# Patient Record
Sex: Male | Born: 2000 | Race: Black or African American | Hispanic: No | Marital: Single | Smoking: Never smoker
Health system: Southern US, Community
[De-identification: ages and names within clinical notes are randomized; demographics above are authoritative.]

---
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
## 2024-01-18 ENCOUNTER — Encounter (HOSPITAL_COMMUNITY): Payer: Self-pay

## 2024-01-18 ENCOUNTER — Ambulatory Visit (HOSPITAL_COMMUNITY)
Admission: EM | Admit: 2024-01-18 | Discharge: 2024-01-18 | Disposition: A | Discharge status: Home / Self Care | Payer: Self-pay | Attending: Emergency Medicine | Admitting: Emergency Medicine

## 2024-01-18 ENCOUNTER — Encounter (HOSPITAL_COMMUNITY): Payer: Self-pay | Admitting: *Deleted

## 2024-01-18 ENCOUNTER — Emergency Department (HOSPITAL_COMMUNITY)
Admission: EM | Admit: 2024-01-18 | Discharge: 2024-01-18 | Discharge status: Against Medical Advice | Payer: Self-pay | Attending: Emergency Medicine | Admitting: Emergency Medicine

## 2024-01-18 ENCOUNTER — Other Ambulatory Visit: Payer: Self-pay

## 2024-01-18 DIAGNOSIS — J392 Other diseases of pharynx: Secondary | ICD-10-CM

## 2024-01-18 DIAGNOSIS — R682 Dry mouth, unspecified: Secondary | ICD-10-CM | POA: Insufficient documentation

## 2024-01-18 DIAGNOSIS — Z5321 Procedure and treatment not carried out due to patient leaving prior to being seen by health care provider: Secondary | ICD-10-CM | POA: Insufficient documentation

## 2024-01-18 LAB — POCT RAPID STREP A (OFFICE): Rapid Strep A Screen: NEGATIVE

## 2024-01-18 NOTE — ED Triage Notes (Signed)
 Patient c/o spitting up mucous for a while now. Patient states he has dry mouth. Patient is a current every day vape smoker.

## 2024-01-18 NOTE — ED Provider Notes (Signed)
 [facility]    CSN: [id number] Arrival date & time: 01/18/24  1654      History   Chief Complaint Chief Complaint  Patient presents with   Dry mouth    HPI Leotha Voeltz is a 23 y.o. male.  3 months of dry mouth and throat Reports vapes every day and the dryness happens after. No throat pain. No fever or cough  Reports moving to [place] in 1 week  History reviewed. No pertinent past medical history.  There are no active problems to display for this patient.   History reviewed. No pertinent surgical history.     Home Medications    Prior to Admission medications   Not on File    Family History History reviewed. No pertinent family history.  Social History Social History   Tobacco Use   Smoking status: Never  Vaping Use   Vaping status: Every Day  Substance Use Topics   Alcohol use: Yes   Drug use: Never     Allergies   Patient has no known allergies.   Review of Systems Review of Systems Per HPI  Physical Exam Triage Vital Signs ED Triage Vitals [01/18/24 1826]  Encounter Vitals Group     BP (!) 144/78     Systolic BP Percentile      Diastolic BP Percentile      Pulse Rate 73     Resp 18     Temp 98 F (36.7 C)     Temp Source Oral     SpO2 95 %     Weight      Height      Head Circumference      Peak Flow      Pain Score      Pain Loc      Pain Education      Exclude from Growth Chart    No data found.  Updated Vital Signs BP (!) 144/78 (BP Location: Left Arm)   Pulse 73   Temp 98 F (36.7 C) (Oral)   Resp 18   SpO2 95%   Visual Acuity Right Eye Distance:   Left Eye Distance:   Bilateral Distance:    Right Eye Near:   Left Eye Near:    Bilateral Near:     Physical Exam Vitals and nursing note reviewed.  Constitutional:      Appearance: He is not ill-appearing.  HENT:     Right Ear: Tympanic membrane and ear canal normal.     Left Ear: Tympanic membrane and ear canal normal.     Nose: No congestion  or rhinorrhea.     Mouth/Throat:     Mouth: Mucous membranes are moist.     Pharynx: Oropharynx is clear. No oropharyngeal exudate or posterior oropharyngeal erythema.  Eyes:     Conjunctiva/sclera: Conjunctivae normal.  Cardiovascular:     Rate and Rhythm: Normal rate and regular rhythm.     Pulses: Normal pulses.     Heart sounds: Normal heart sounds.  Pulmonary:     Effort: Pulmonary effort is normal. No respiratory distress.     Breath sounds: Normal breath sounds. No wheezing.  Musculoskeletal:     Cervical back: Normal range of motion.  Lymphadenopathy:     Cervical: No cervical adenopathy.  Skin:    General: Skin is warm and dry.  Neurological:     Mental Status: He is alert and oriented to person, place, and time.     UC Treatments /  Results  Labs (all labs ordered are listed, but only abnormal results are displayed) Labs Reviewed  POCT RAPID STREP A (OFFICE)    EKG  Radiology No results found.  Procedures Procedures (including critical care time)  Medications Ordered in UC Medications - No data to display  Initial Impression / Assessment and Plan / UC Course  I have reviewed the triage vital signs and the nursing notes.  Pertinent labs & imaging results that were available during my care of the patient were reviewed by me and considered in my medical decision making (see chart for details).  A strep test was performed by triage CMA before provider notified. Test was not indicated and not appropriate; please disregard  3+ months of dry throat occurs after vaping No abnormality on exam Eating and drinking normally Advised stop vaping When moves out of state advised establish with PCP for follow up  Final Clinical Impressions(s) / UC Diagnoses   Final diagnoses:  Dry throat     Discharge Instructions      I recommend to stop vaping as this is likely making your throat dry.  When you move to [facility], please establish with a primary care as soon  as able     ED Prescriptions   None    PDMP not reviewed this encounter.   Brandii Lakey, Ray Church 01/18/24 1096

## 2024-01-18 NOTE — ED Triage Notes (Signed)
 The pt is c/o a dry throat for 3-4 months and he has been spitting s lot for a year  no distress

## 2024-01-18 NOTE — Discharge Instructions (Addendum)
 I recommend to stop vaping as this is likely making your throat dry.  When you move to [facility], please establish with a primary care as soon as able

## 2024-06-13 ENCOUNTER — Encounter: Payer: Self-pay | Admitting: Advanced Practice Midwife

## 2024-11-25 ENCOUNTER — Other Ambulatory Visit: Payer: Self-pay

## 2024-11-25 ENCOUNTER — Emergency Department (HOSPITAL_COMMUNITY): Payer: Self-pay

## 2024-11-25 ENCOUNTER — Emergency Department (HOSPITAL_COMMUNITY)
Admission: EM | Admit: 2024-11-25 | Discharge: 2024-11-26 | Disposition: A | Discharge status: Home / Self Care | Payer: Self-pay | Attending: Emergency Medicine | Admitting: Emergency Medicine

## 2024-11-25 DIAGNOSIS — R519 Headache, unspecified: Secondary | ICD-10-CM | POA: Insufficient documentation

## 2024-11-25 DIAGNOSIS — J029 Acute pharyngitis, unspecified: Secondary | ICD-10-CM | POA: Insufficient documentation

## 2024-11-25 DIAGNOSIS — M791 Myalgia, unspecified site: Secondary | ICD-10-CM | POA: Insufficient documentation

## 2024-11-25 DIAGNOSIS — R0981 Nasal congestion: Secondary | ICD-10-CM | POA: Insufficient documentation

## 2024-11-25 DIAGNOSIS — R059 Cough, unspecified: Secondary | ICD-10-CM | POA: Insufficient documentation

## 2024-11-25 DIAGNOSIS — J111 Influenza due to unidentified influenza virus with other respiratory manifestations: Secondary | ICD-10-CM

## 2024-11-25 NOTE — ED Triage Notes (Signed)
 Quick triage note: Pt to ED c/o flu symptoms x 3 days,. Congestion, cough, sore throat.

## 2024-11-26 MED ORDER — IBUPROFEN 400 MG PO TABS
600.0000 mg | ORAL_TABLET | Freq: Once | ORAL | Status: AC
  Administered 2024-11-26: 600 mg via ORAL
  Filled 2024-11-26: qty 1

## 2024-11-26 MED ORDER — GUAIFENESIN 100 MG/5ML PO LIQD
5.0000 mL | Freq: Once | ORAL | Status: AC
  Administered 2024-11-26: 5 mL via ORAL
  Filled 2024-11-26: qty 10

## 2024-11-26 MED ORDER — ACETAMINOPHEN 500 MG PO TABS
1000.0000 mg | ORAL_TABLET | Freq: Once | ORAL | Status: AC
  Administered 2024-11-26: 1000 mg via ORAL
  Filled 2024-11-26: qty 2

## 2024-11-26 NOTE — Discharge Instructions (Signed)
 As we discussed, you most likely have the flu.  Please follow-up outpatient with your PCP once you return to [place] .  Take Tylenol every 6 hours for headaches and fevers.  Take Tylenol every 6 hours as needed for body aches.  Please take Zicam which is over-the-counter.  Eat a high-protein low-fat diet.  Push plenty of fluids such as Pedialyte.  Return to ED with new symptoms.

## 2024-11-26 NOTE — ED Provider Notes (Signed)
 " [facility] AT [facility] Provider Note   CSN: [id number] Arrival date & time: 11/25/24  2037     Patient presents with: flu sx and Cough   Sean Gibbs is a 23 y.o. male with no medical history documented.  Presents to ED complaining of URI symptoms to include nasal congestion, sore throat, bodyaches and chills, headache.  Denies any fevers at home.  Denies any known sick contacts but does report that he is here visiting from [place] .  Denies any nausea, vomiting, abdominal pain.  Reports he has taken Tylenol 1 time for symptoms which alleviated symptoms slightly.  He denies any chest pain.  Denies any shortness of breath.  Overall nontoxic in appearance.     Cough      Prior to Admission medications  Not on File    Allergies: Patient has no known allergies.    Review of Systems  Respiratory:  Positive for cough.   All other systems reviewed and are negative.   Updated Vital Signs BP 119/69 (BP Location: Right Arm)   Pulse 66   Temp 97.7 F (36.5 C)   Resp 16   SpO2 100%   Physical Exam Vitals and nursing note reviewed.  Constitutional:      General: He is not in acute distress.    Appearance: He is well-developed.  HENT:     Head: Normocephalic and atraumatic.     Mouth/Throat:     Pharynx: Posterior oropharyngeal erythema present. No oropharyngeal exudate.     Comments: Erythema to posterior oropharynx.  Uvula midline.  Handling secretions appropriately.  No drooling or change in phonation. Eyes:     Conjunctiva/sclera: Conjunctivae normal.  Cardiovascular:     Rate and Rhythm: Normal rate and regular rhythm.     Heart sounds: No murmur heard. Pulmonary:     Effort: Pulmonary effort is normal. No respiratory distress.     Breath sounds: Normal breath sounds.  Abdominal:     Palpations: Abdomen is soft.     Tenderness: There is no abdominal tenderness.     Comments: Abdomen soft and nontender  Musculoskeletal:         General: No swelling.     Cervical back: Neck supple.  Skin:    General: Skin is warm and dry.     Capillary Refill: Capillary refill takes less than 2 seconds.  Neurological:     Mental Status: He is alert and oriented to person, place, and time.  Psychiatric:        Mood and Affect: Mood normal.     (all labs ordered are listed, but only abnormal results are displayed) Labs Reviewed - No data to display  EKG: None  Radiology: DG Chest 2 View Result Date: 11/25/2024 CLINICAL DATA:  Cough, chest congestion. EXAM: CHEST - 2 VIEW COMPARISON:  None Available. FINDINGS: The cardiomediastinal contours are normal. The lungs are clear. Pulmonary vasculature is normal. No consolidation, pleural effusion, or pneumothorax. No acute osseous abnormalities are seen. IMPRESSION: No active cardiopulmonary disease. Electronically Signed   By: Andrea Gasman M.D.   On: 11/25/2024 23:23    Procedures   Medications Ordered in the ED  ibuprofen  (ADVIL ) tablet 600 mg (has no administration in time range)  acetaminophen (TYLENOL) tablet 1,000 mg (has no administration in time range)  guaiFENesin (ROBITUSSIN) 100 MG/5ML liquid 5 mL (has no administration in time range)    Medical Decision Making Amount and/or Complexity of Data Reviewed Radiology: ordered.  Risk OTC drugs.   This is a 23 year old male presenting to the ED due to concern of URI symptoms.  On exam, HD stable.  Lung sounds clear bilaterally, no hypoxia.  Abdomen soft and compressible.  Neurological examination is at baseline.  Overall patient is nontoxic in appearance.  Patient chest x-ray collected in triage unremarkable.  Patient reporting here with 3 days of URI symptoms.  Patient most likely has the flu.  Unfortunately, there is a shortage of tests in the hospital right now and these are being ration for the most sick patients.  Patient is hemodynamically stable.  He has no fever, no tachycardia.  He is overall nontoxic  appearance.  Patient most likely has the flu.  He was advised to take symptomatic treatment at home.  He was advised to follow-up outpatient with his PCP in [place] .  Stable to discharge.   Final diagnoses:  Influenza-like illness    ED Discharge Orders     None          Montrey Buist F, PA-C 11/26/24 0355    Lorette Mayo, MD 11/26/24 0425  "
